# Patient Record
Sex: Female | Born: 1985 | Race: White | Hispanic: No | Marital: Single | Smoking: Current every day smoker
Health system: Southern US, Community
[De-identification: ages and names within clinical notes are randomized; demographics above are authoritative.]

## PROBLEM LIST (undated history)

## (undated) DIAGNOSIS — F319 Bipolar disorder, unspecified: Secondary | ICD-10-CM

## (undated) DIAGNOSIS — F419 Anxiety disorder, unspecified: Secondary | ICD-10-CM

## (undated) HISTORY — PX: TONSILLECTOMY: SUR1361

---

## 2014-10-19 ENCOUNTER — Emergency Department (HOSPITAL_COMMUNITY): Payer: Self-pay

## 2014-10-19 ENCOUNTER — Emergency Department (HOSPITAL_COMMUNITY)
Admission: EM | Admit: 2014-10-19 | Discharge: 2014-10-20 | Disposition: A | Discharge status: Home / Self Care | Payer: Self-pay | Attending: Emergency Medicine | Admitting: Emergency Medicine

## 2014-10-19 ENCOUNTER — Encounter (HOSPITAL_COMMUNITY): Payer: Self-pay

## 2014-10-19 DIAGNOSIS — F319 Bipolar disorder, unspecified: Secondary | ICD-10-CM | POA: Insufficient documentation

## 2014-10-19 DIAGNOSIS — Z720 Tobacco use: Secondary | ICD-10-CM | POA: Insufficient documentation

## 2014-10-19 DIAGNOSIS — M549 Dorsalgia, unspecified: Secondary | ICD-10-CM | POA: Insufficient documentation

## 2014-10-19 DIAGNOSIS — K5900 Constipation, unspecified: Secondary | ICD-10-CM | POA: Insufficient documentation

## 2014-10-19 DIAGNOSIS — Z79899 Other long term (current) drug therapy: Secondary | ICD-10-CM | POA: Insufficient documentation

## 2014-10-19 DIAGNOSIS — Z3202 Encounter for pregnancy test, result negative: Secondary | ICD-10-CM | POA: Insufficient documentation

## 2014-10-19 DIAGNOSIS — F419 Anxiety disorder, unspecified: Secondary | ICD-10-CM | POA: Insufficient documentation

## 2014-10-19 HISTORY — DX: Anxiety disorder, unspecified: F41.9

## 2014-10-19 HISTORY — DX: Bipolar disorder, unspecified: F31.9

## 2014-10-19 LAB — CBC
HEMATOCRIT: 40.2 % (ref 36.0–46.0)
Hemoglobin: 13.1 g/dL (ref 12.0–15.0)
MCH: 29.2 pg (ref 26.0–34.0)
MCHC: 32.6 g/dL (ref 30.0–36.0)
MCV: 89.5 fL (ref 78.0–100.0)
Platelets: 331 10*3/uL (ref 150–400)
RBC: 4.49 MIL/uL (ref 3.87–5.11)
RDW: 13.1 % (ref 11.5–15.5)
WBC: 11.1 10*3/uL — ABNORMAL HIGH (ref 4.0–10.5)

## 2014-10-19 LAB — POC URINE PREG, ED: PREG TEST UR: NEGATIVE

## 2014-10-19 MED ORDER — MILK AND MOLASSES ENEMA
1.0000 | Freq: Once | RECTAL | Status: AC
  Administered 2014-10-20: 250 mL via RECTAL
  Filled 2014-10-19: qty 250

## 2014-10-19 NOTE — ED Notes (Addendum)
Patient reports she has not had a bowel movement in 2 weeks.  She has been vomiting x 3 days.  No relief with stool softeners, Dulcolax suppository, or Fleets enema.She has also experienced hematuria.  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

## 2014-10-19 NOTE — ED Provider Notes (Signed)
CSN: [id number]     Arrival date & time 10/19/14  2217 History   First MD Initiated Contact with Patient 10/19/14 2250     Chief Complaint  Patient presents with  . Constipation     (Consider location/radiation/quality/duration/timing/severity/associated sxs/prior Treatment) Patient is a 29 y.o. female presenting with constipation. The history is provided by the patient and a friend. No language interpreter was used.  Constipation Severity:  Moderate Time since last bowel movement:  18 days Timing:  Constant Progression:  Unchanged Chronicity:  Recurrent Context: medication   Stool description:  None produced Unusual stool frequency:  Weekly Relieved by:  Nothing Worsened by:  Nothing tried Ineffective treatments:  Enemas, stool softeners, voiding and vomiting Associated symptoms: abdominal pain, anorexia, back pain, dysuria, nausea, urinary retention and vomiting   Associated symptoms: no diarrhea, no fever, no flatus and no hematochezia   Abdominal pain:    Location:  Generalized   Quality:  Cramping and sharp   Severity:  Moderate   Onset quality:  Gradual   Timing:  Constant   Progression:  Worsening   Chronicity:  New   Past Medical History  Diagnosis Date  . Anxiety   . Bipolar 1 disorder    Past Surgical History  Procedure Laterality Date  . Tonsillectomy     No family history on file. History  Substance Use Topics  . Smoking status: Current Every Day Smoker -- 1.00 packs/day    Types: Cigarettes  . Smokeless tobacco: Not on file  . Alcohol Use: No   OB History    No data available     Review of Systems  Constitutional: Negative for fever, chills, diaphoresis, activity change, appetite change and fatigue.  HENT: Negative for congestion, facial swelling, rhinorrhea and sore throat.   Eyes: Negative for photophobia and discharge.  Respiratory: Negative for cough, chest tightness and shortness of breath.   Cardiovascular: Negative for chest pain,  palpitations and leg swelling.  Gastrointestinal: Positive for nausea, vomiting, abdominal pain, constipation and anorexia. Negative for diarrhea, hematochezia and flatus.  Endocrine: Negative for polydipsia and polyuria.  Genitourinary: Positive for dysuria. Negative for frequency, difficulty urinating and pelvic pain.  Musculoskeletal: Positive for back pain. Negative for arthralgias, neck pain and neck stiffness.  Skin: Negative for color change and wound.  Allergic/Immunologic: Negative for immunocompromised state.  Neurological: Negative for facial asymmetry, weakness, numbness and headaches.  Hematological: Does not bruise/bleed easily.  Psychiatric/Behavioral: Negative for confusion and agitation.      Allergies  Bactrim; Ciprofloxacin; Erythromycin; Flagyl; and Macrobid  Home Medications   Prior to Admission medications   Medication Sig Start Date End Date Taking? Authorizing Provider  cephALEXin (KEFLEX) 500 MG capsule Take 1 capsule (500 mg total) by mouth 2 (two) times daily. 10/20/14   Toy CookeyMegan Docherty, MD  gabapentin (NEURONTIN) 300 MG capsule Take 300 mg by mouth 3 (three) times daily.   Yes Historical Provider, MD  lithium 600 MG capsule Take 600 mg by mouth at bedtime.   Yes Historical Provider, MD  methadone (DOLOPHINE) 10 MG/ML solution Take 160 mg by mouth daily.   Yes Historical Provider, MD  ondansetron (ZOFRAN) 4 MG tablet Take 1 tablet (4 mg total) by mouth every 6 (six) hours as needed for nausea or vomiting. 10/20/14   Toy CookeyMegan Docherty, MD  polyethylene glycol powder (MIRALAX) powder Take 255 g by mouth once. 10/20/14   Toy CookeyMegan Docherty, MD   BP 109/88 mmHg  Pulse 88  Temp(Src) 99 F (37.2 C) (  Oral)  Resp 18  Ht  (1.575 m)  Wt 130 lb (58.968 kg)  BMI 23.77 kg/m2  SpO2 100%  LMP 09/28/2014 (Approximate) Physical Exam  Constitutional: She is oriented to person, place, and time. She appears well-developed and well-nourished. No distress.  HENT:  Head:  Normocephalic and atraumatic.  Mouth/Throat: No oropharyngeal exudate.  Eyes: Pupils are equal, round, and reactive to light.  Neck: Normal range of motion. Neck supple.  Cardiovascular: Normal rate, regular rhythm and normal heart sounds.  Exam reveals no gallop and no friction rub.   No murmur heard. Pulmonary/Chest: Effort normal and breath sounds normal. No respiratory distress. She has no wheezes. She has no rales.  Abdominal: Soft. Bowel sounds are normal. She exhibits no distension and no mass. There is generalized tenderness. There is no rigidity, no rebound and no guarding.  Musculoskeletal: Normal range of motion. She exhibits no edema or tenderness.  Neurological: She is alert and oriented to person, place, and time.  Skin: Skin is warm and dry.  Psychiatric: She has a normal mood and affect.    ED Course  Procedures (including critical care time) Labs Review Labs Reviewed  URINALYSIS, ROUTINE W REFLEX MICROSCOPIC - Abnormal; Notable for the following:    APPearance CLOUDY (*)    Nitrite POSITIVE (*)    All other components within normal limits  CBC - Abnormal; Notable for the following:    WBC 11.1 (*)    All other components within normal limits  COMPREHENSIVE METABOLIC PANEL - Abnormal; Notable for the following:    Total Bilirubin 0.1 (*)    All other components within normal limits  URINE MICROSCOPIC-ADD ON - Abnormal; Notable for the following:    Squamous Epithelial / LPF MANY (*)    Bacteria, UA MANY (*)    All other components within normal limits  POC URINE PREG, ED    Imaging Review Dg Abd Acute W/chest  10/19/2014   CLINICAL DATA:  Has not had bowel movement in 2 weeks. Lower abdominal pain and nausea. Initial encounter.  EXAM: ACUTE ABDOMEN SERIES (ABDOMEN 2 VIEW & CHEST 1 VIEW)  COMPARISON:  None.  FINDINGS: The lungs are well-aerated and clear. There is no evidence of focal opacification, pleural effusion or pneumothorax. The cardiomediastinal silhouette  is within normal limits. Density overlying the right upper lung zone likely reflects right-sided costochondral calcification.  The visualized bowel gas pattern is unremarkable. Scattered stool and air are seen within the colon; there is no evidence of small bowel dilatation to suggest obstruction. No free intra-abdominal air is identified on the provided upright view.  No acute osseous abnormalities are seen; the sacroiliac joints are unremarkable in appearance.  IMPRESSION: 1. Unremarkable bowel gas pattern; no free intra-abdominal air seen. Moderate to large amount of stool noted in the colon. This raises concern for mild constipation. 2. No acute cardiopulmonary process seen.   Electronically Signed   By: Roanna Raider M.D.   On: 10/19/2014 23:43     EKG Interpretation None      MDM   Final diagnoses:  Constipation, unspecified constipation type    Pt is a 29 y.o. female with Pmhx as above who presents with 18 days of constipation, also with 3 days of n/v, dysuria/hematuria. On PE, P thas low grade temp, VS otherwise stable, in NAD. +bowel sounds, +generalized tenderness w/o rebound or guarding. No large stool burden on rectal exam. AAS, CBC, CMP, UA ordered.   AAS with nml bowel gas  pattern with moderate to large stool in the colon. Mild leukocytosis seen.   Urine likely infected, will treat w/ keflex. Pt has had stool out after M&M edema, Feels somewhat improved.  Patient will be discharged home with instructions for MiraLAX use as well as Zofran for continued symptoms.   [place] [facility] evaluation in the Emergency Department is complete. It has been determined that no acute conditions requiring further emergency intervention are present at this time. The patient/guardian have been advised of the diagnosis and plan. We have discussed signs and symptoms that warrant return to the ED, such as changes or worsening in symptoms, worsening pain, fever, inability to tolerate  liquids.      Toy Cookey, MD 10/20/14 [phone]

## 2014-10-20 LAB — COMPREHENSIVE METABOLIC PANEL
ALT: 9 U/L (ref 0–35)
ANION GAP: 10 (ref 5–15)
AST: 17 U/L (ref 0–37)
Albumin: 4.4 g/dL (ref 3.5–5.2)
Alkaline Phosphatase: 67 U/L (ref 39–117)
BUN: 13 mg/dL (ref 6–23)
CO2: 26 mmol/L (ref 19–32)
Calcium: 9.4 mg/dL (ref 8.4–10.5)
Chloride: 106 mmol/L (ref 96–112)
Creatinine, Ser: 0.66 mg/dL (ref 0.50–1.10)
GFR calc Af Amer: >90 mL/min (ref >90)
Glucose, Bld: 83 mg/dL (ref 70–99)
Potassium: 3.9 mmol/L (ref 3.5–5.1)
Sodium: 142 mmol/L (ref 135–145)
Total Bilirubin: 0.1 mg/dL — ABNORMAL LOW (ref 0.3–1.2)
Total Protein: 7.6 g/dL (ref 6.0–8.3)

## 2014-10-20 LAB — URINALYSIS, ROUTINE W REFLEX MICROSCOPIC
Bilirubin Urine: NEGATIVE
GLUCOSE, UA: NEGATIVE mg/dL
HGB URINE DIPSTICK: NEGATIVE
KETONES UR: NEGATIVE mg/dL
LEUKOCYTES UA: NEGATIVE
Nitrite: POSITIVE — AB
PH: 5.5 (ref 5.0–8.0)
Protein, ur: NEGATIVE mg/dL
SPECIFIC GRAVITY, URINE: 1.03 (ref 1.005–1.030)
Urobilinogen, UA: 1 mg/dL (ref 0.0–1.0)

## 2014-10-20 LAB — URINE MICROSCOPIC-ADD ON

## 2014-10-20 MED ORDER — POLYETHYLENE GLYCOL 3350 17 GM/SCOOP PO POWD: 1.0000 | Freq: Once | ORAL | Status: AC

## 2014-10-20 MED ORDER — ONDANSETRON HCL 4 MG PO TABS: 4.0000 mg | ORAL_TABLET | Freq: Four times a day (QID) | ORAL | Status: AC | PRN

## 2014-10-20 MED ORDER — CASTOR OIL OIL
30.0000 mL | TOPICAL_OIL | Freq: Once | Status: AC
  Administered 2014-10-20: 30 mL via ORAL
  Filled 2014-10-20: qty 60

## 2014-10-20 MED ORDER — CEPHALEXIN 500 MG PO CAPS: 500.0000 mg | ORAL_CAPSULE | Freq: Two times a day (BID) | ORAL | Status: AC

## 2014-10-20 NOTE — Discharge Instructions (Signed)

## 2016-08-19 IMAGING — CR DG ABDOMEN ACUTE W/ 1V CHEST
3 series · 3 of 3 positions shown · non-contrast
Comparison: None.

CLINICAL DATA: Has not had bowel movement in 2 weeks. Lower
abdominal pain and nausea. Initial encounter.

EXAM:
ACUTE ABDOMEN SERIES (ABDOMEN 2 VIEW & CHEST 1 VIEW)

[w chest pa]
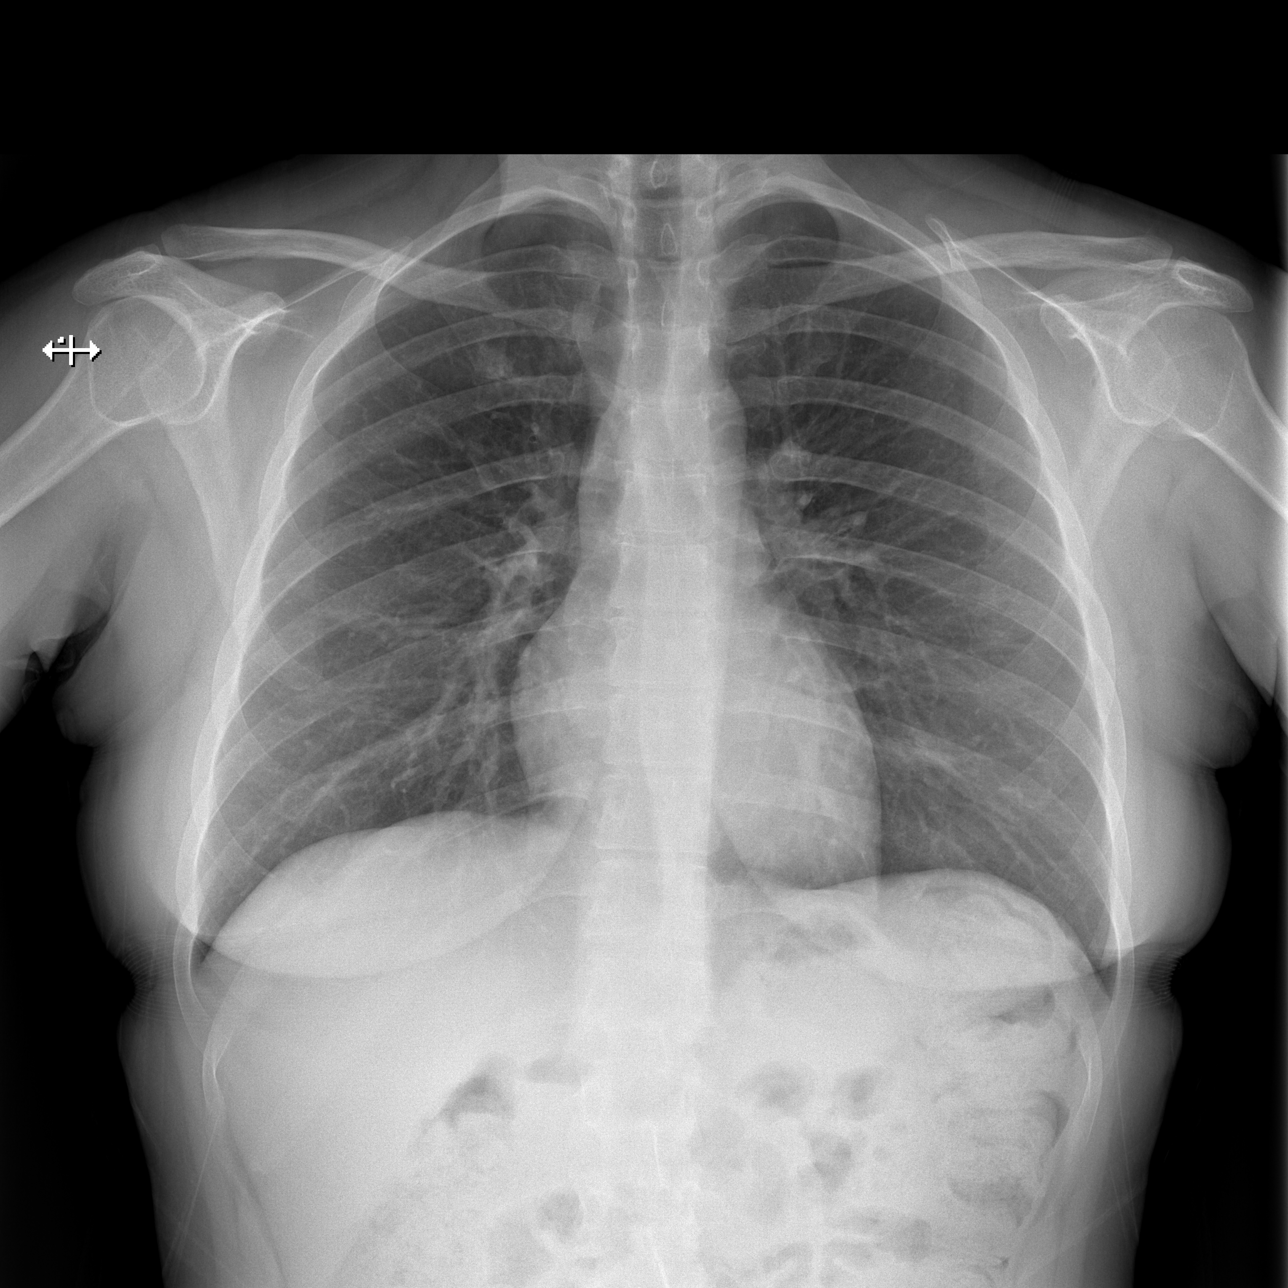

[w abdomen upright]
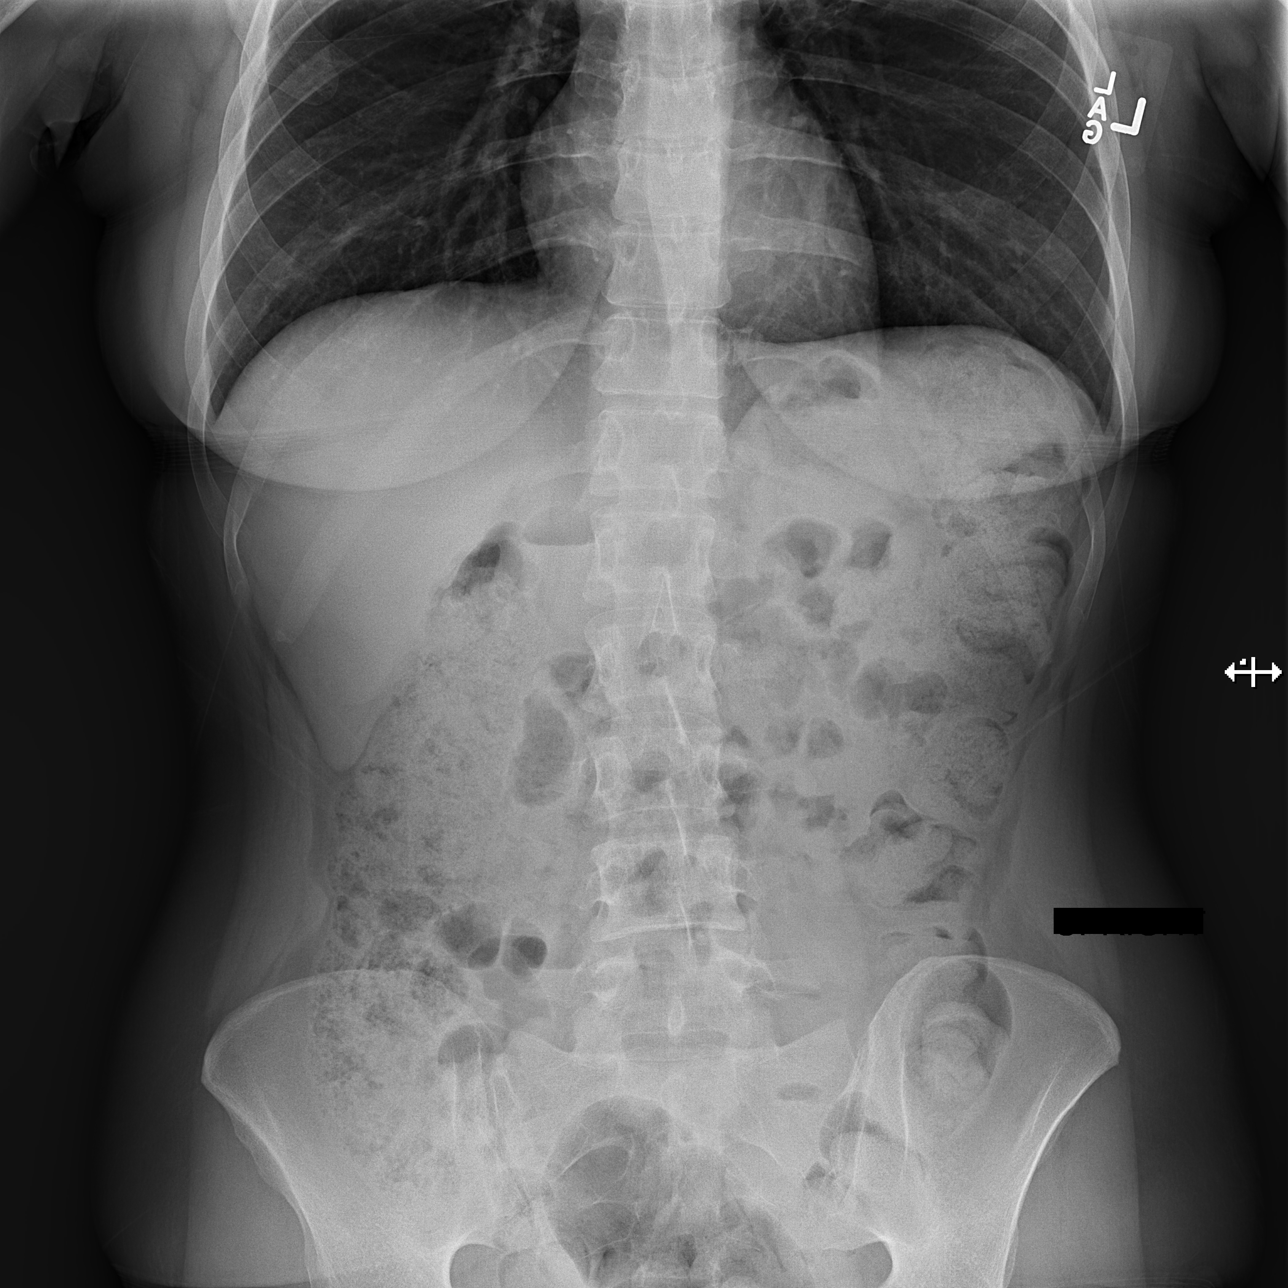

[t abdomen supine]
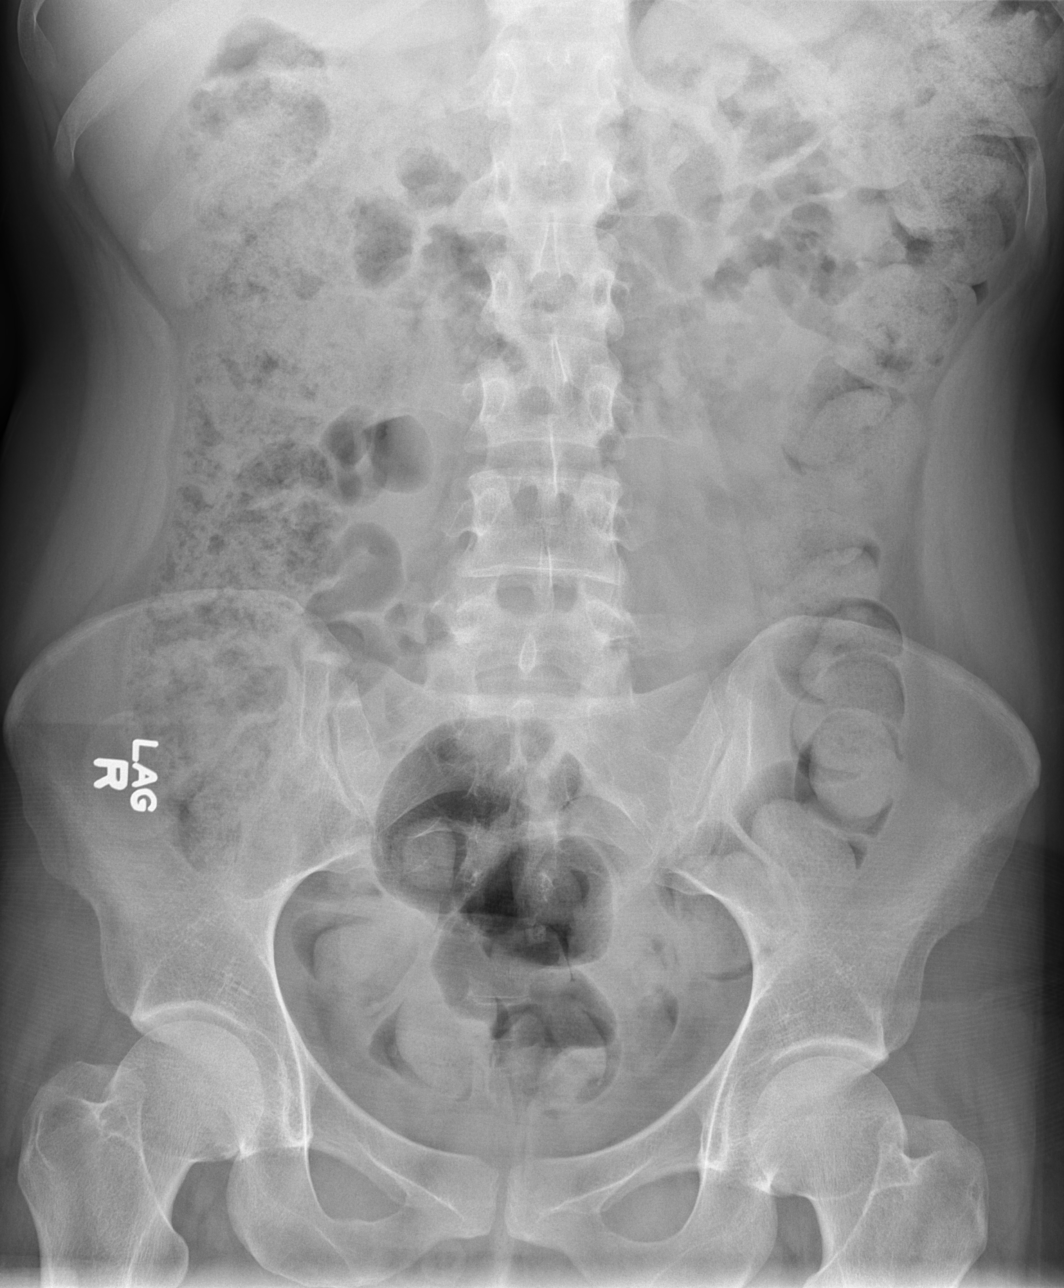

[3 of 3 positions shown; findings below may reference images not displayed]

FINDINGS: The lungs are well-aerated and clear. There is no evidence of focal
opacification, pleural effusion or pneumothorax. The
cardiomediastinal silhouette is within normal limits. Density
overlying the right upper lung zone likely reflects right-sided
costochondral calcification.

The visualized bowel gas pattern is unremarkable. Scattered stool
and air are seen within the colon; there is no evidence of small
bowel dilatation to suggest obstruction. No free intra-abdominal air
is identified on the provided upright view.

No acute osseous abnormalities are seen; the sacroiliac joints are
unremarkable in appearance.
IMPRESSION: 1. Unremarkable bowel gas pattern; no free intra-abdominal air seen.
Moderate to large amount of stool noted in the colon. This raises
concern for mild constipation.
2. No acute cardiopulmonary process seen.
